# Patient Record
Sex: Male | Born: 1995 | Race: Black or African American | Hispanic: No | Marital: Single | Smoking: Never smoker
Health system: Southern US, Community
[De-identification: ages and names within clinical notes are randomized; demographics above are authoritative.]

## PROBLEM LIST (undated history)

## (undated) DIAGNOSIS — L309 Dermatitis, unspecified: Secondary | ICD-10-CM

## (undated) DIAGNOSIS — T7840XA Allergy, unspecified, initial encounter: Secondary | ICD-10-CM

## (undated) DIAGNOSIS — J45909 Unspecified asthma, uncomplicated: Secondary | ICD-10-CM

---
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
## 2017-09-13 ENCOUNTER — Emergency Department (HOSPITAL_COMMUNITY)
Admission: EM | Admit: 2017-09-13 | Discharge: 2017-09-13 | Disposition: A | Discharge status: Home / Self Care | Payer: Self-pay | Attending: Emergency Medicine | Admitting: Emergency Medicine

## 2017-09-13 ENCOUNTER — Encounter (HOSPITAL_COMMUNITY): Payer: Self-pay | Admitting: Emergency Medicine

## 2017-09-13 DIAGNOSIS — Z79899 Other long term (current) drug therapy: Secondary | ICD-10-CM | POA: Insufficient documentation

## 2017-09-13 DIAGNOSIS — Z7722 Contact with and (suspected) exposure to environmental tobacco smoke (acute) (chronic): Secondary | ICD-10-CM | POA: Insufficient documentation

## 2017-09-13 DIAGNOSIS — K623 Rectal prolapse: Secondary | ICD-10-CM | POA: Insufficient documentation

## 2017-09-13 DIAGNOSIS — J45909 Unspecified asthma, uncomplicated: Secondary | ICD-10-CM | POA: Insufficient documentation

## 2017-09-13 NOTE — ED Triage Notes (Signed)
Pt c/o rectal pain, pt  States he has had hemorrhoids in the past and yesterday he sat on hard cement for an extended period of time resulting in same type of pain.
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
## 2018-01-18 ENCOUNTER — Emergency Department: Payer: Self-pay

## 2018-01-18 ENCOUNTER — Encounter: Payer: Self-pay | Admitting: Emergency Medicine

## 2018-01-18 ENCOUNTER — Other Ambulatory Visit: Payer: Self-pay

## 2018-01-18 ENCOUNTER — Emergency Department
Admission: EM | Admit: 2018-01-18 | Discharge: 2018-01-18 | Payer: Self-pay | Attending: Emergency Medicine | Admitting: Emergency Medicine

## 2018-01-18 DIAGNOSIS — S022XXA Fracture of nasal bones, initial encounter for closed fracture: Secondary | ICD-10-CM | POA: Insufficient documentation

## 2018-01-18 DIAGNOSIS — R600 Localized edema: Secondary | ICD-10-CM | POA: Insufficient documentation

## 2018-01-18 DIAGNOSIS — S0181XA Laceration without foreign body of other part of head, initial encounter: Secondary | ICD-10-CM | POA: Insufficient documentation

## 2018-01-18 DIAGNOSIS — Y9389 Activity, other specified: Secondary | ICD-10-CM | POA: Insufficient documentation

## 2018-01-18 DIAGNOSIS — H53121 Transient visual loss, right eye: Secondary | ICD-10-CM | POA: Insufficient documentation

## 2018-01-18 DIAGNOSIS — Z7722 Contact with and (suspected) exposure to environmental tobacco smoke (acute) (chronic): Secondary | ICD-10-CM | POA: Insufficient documentation

## 2018-01-18 DIAGNOSIS — Y92149 Unspecified place in prison as the place of occurrence of the external cause: Secondary | ICD-10-CM | POA: Insufficient documentation

## 2018-01-18 DIAGNOSIS — R51 Headache: Secondary | ICD-10-CM | POA: Insufficient documentation

## 2018-01-18 DIAGNOSIS — M542 Cervicalgia: Secondary | ICD-10-CM | POA: Insufficient documentation

## 2018-01-18 DIAGNOSIS — Y999 Unspecified external cause status: Secondary | ICD-10-CM | POA: Insufficient documentation

## 2018-01-18 DIAGNOSIS — J45909 Unspecified asthma, uncomplicated: Secondary | ICD-10-CM | POA: Insufficient documentation

## 2018-01-18 MED ORDER — KETOROLAC TROMETHAMINE 30 MG/ML IJ SOLN
30.0000 mg | Freq: Once | INTRAMUSCULAR | Status: AC
  Administered 2018-01-18: 30 mg via INTRAMUSCULAR
  Filled 2018-01-18: qty 1

## 2018-01-18 MED ORDER — PROMETHAZINE HCL 25 MG/ML IJ SOLN
25.0000 mg | Freq: Once | INTRAMUSCULAR | Status: AC
  Administered 2018-01-18: 25 mg via INTRAMUSCULAR
  Filled 2018-01-18: qty 1

## 2018-01-18 MED ORDER — OXYCODONE-ACETAMINOPHEN 5-325 MG PO TABS
1.0000 | ORAL_TABLET | Freq: Once | ORAL | Status: AC
  Administered 2018-01-18: 1 via ORAL
  Filled 2018-01-18: qty 1

## 2018-01-18 MED ORDER — DIPHENHYDRAMINE HCL 50 MG/ML IJ SOLN
50.0000 mg | Freq: Once | INTRAMUSCULAR | Status: AC
  Administered 2018-01-18: 50 mg via INTRAMUSCULAR
  Filled 2018-01-18: qty 1

## 2018-01-18 MED ORDER — LIDOCAINE HCL (PF) 1 % IJ SOLN
10.0000 mL | Freq: Once | INTRAMUSCULAR | Status: AC
  Administered 2018-01-18: 10 mL
  Filled 2018-01-18: qty 10

## 2018-01-18 MED ORDER — ACETAMINOPHEN-CODEINE #3 300-30 MG PO TABS: 1.0000 | ORAL_TABLET | Freq: Four times a day (QID) | ORAL | 0 refills | Status: AC | PRN
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
## 2018-01-18 NOTE — ED Notes (Signed)
Christiane Ha , PA at bedisde at this time

## 2021-03-12 ENCOUNTER — Emergency Department (HOSPITAL_BASED_OUTPATIENT_CLINIC_OR_DEPARTMENT_OTHER): Payer: Self-pay

## 2021-03-12 ENCOUNTER — Other Ambulatory Visit: Payer: Self-pay

## 2021-03-12 ENCOUNTER — Emergency Department (HOSPITAL_BASED_OUTPATIENT_CLINIC_OR_DEPARTMENT_OTHER)
Admission: EM | Admit: 2021-03-12 | Discharge: 2021-03-12 | Disposition: A | Discharge status: Home / Self Care | Payer: Self-pay | Attending: Emergency Medicine | Admitting: Emergency Medicine

## 2021-03-12 ENCOUNTER — Encounter (HOSPITAL_BASED_OUTPATIENT_CLINIC_OR_DEPARTMENT_OTHER): Payer: Self-pay

## 2021-03-12 DIAGNOSIS — J069 Acute upper respiratory infection, unspecified: Secondary | ICD-10-CM | POA: Insufficient documentation

## 2021-03-12 DIAGNOSIS — Z20822 Contact with and (suspected) exposure to covid-19: Secondary | ICD-10-CM | POA: Insufficient documentation

## 2021-03-12 DIAGNOSIS — J4521 Mild intermittent asthma with (acute) exacerbation: Secondary | ICD-10-CM | POA: Insufficient documentation

## 2021-03-12 DIAGNOSIS — Z7722 Contact with and (suspected) exposure to environmental tobacco smoke (acute) (chronic): Secondary | ICD-10-CM | POA: Insufficient documentation

## 2021-03-12 LAB — RESP PANEL BY RT-PCR (FLU A&B, COVID) ARPGX2
Influenza A by PCR: NEGATIVE
Influenza B by PCR: NEGATIVE
SARS Coronavirus 2 by RT PCR: NEGATIVE

## 2021-03-12 MED ORDER — PREDNISONE 50 MG PO TABS
60.0000 mg | ORAL_TABLET | Freq: Once | ORAL | Status: AC
  Administered 2021-03-12: 60 mg via ORAL
  Filled 2021-03-12: qty 1

## 2021-03-12 MED ORDER — PREDNISONE 10 MG PO TABS: 20.0000 mg | ORAL_TABLET | Freq: Every day | ORAL | 0 refills | Status: DC

## 2021-03-12 MED ORDER — ALBUTEROL SULFATE HFA 108 (90 BASE) MCG/ACT IN AERS
2.0000 | INHALATION_SPRAY | RESPIRATORY_TRACT | Status: DC | PRN
  Administered 2021-03-12: 2 via RESPIRATORY_TRACT
  Filled 2021-03-12: qty 6.7

## 2021-03-12 NOTE — ED Triage Notes (Signed)
Shortness of breath starting app 30 mins prior to arrival.  Does has asthma history.
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
## 2021-05-15 ENCOUNTER — Other Ambulatory Visit: Payer: Self-pay

## 2021-05-15 DIAGNOSIS — Z79899 Other long term (current) drug therapy: Secondary | ICD-10-CM | POA: Insufficient documentation

## 2021-05-15 DIAGNOSIS — N342 Other urethritis: Secondary | ICD-10-CM | POA: Insufficient documentation

## 2021-05-15 DIAGNOSIS — Z7952 Long term (current) use of systemic steroids: Secondary | ICD-10-CM | POA: Insufficient documentation

## 2021-05-15 DIAGNOSIS — J45909 Unspecified asthma, uncomplicated: Secondary | ICD-10-CM | POA: Insufficient documentation

## 2021-05-15 NOTE — ED Triage Notes (Signed)
°  Patient comes in with lower abdominal pain and dysuria that started about two days ago.  Patient states pain is sharp but intermittent.  Had white discharge from penis yesterday and some nausea.  Denies multiple sexual partners.  Pain 2/10 at this time.

## 2021-05-16 ENCOUNTER — Emergency Department (HOSPITAL_BASED_OUTPATIENT_CLINIC_OR_DEPARTMENT_OTHER)
Admission: EM | Admit: 2021-05-16 | Discharge: 2021-05-16 | Disposition: A | Discharge status: Home / Self Care | Payer: Self-pay | Attending: Emergency Medicine | Admitting: Emergency Medicine

## 2021-05-16 ENCOUNTER — Encounter (HOSPITAL_BASED_OUTPATIENT_CLINIC_OR_DEPARTMENT_OTHER): Payer: Self-pay | Admitting: Emergency Medicine

## 2021-05-16 DIAGNOSIS — N342 Other urethritis: Secondary | ICD-10-CM

## 2021-05-16 LAB — HIV ANTIBODY (ROUTINE TESTING W REFLEX): HIV Screen 4th Generation wRfx: NONREACTIVE

## 2021-05-16 LAB — URINALYSIS, ROUTINE W REFLEX MICROSCOPIC
Bilirubin Urine: NEGATIVE
Glucose, UA: NEGATIVE mg/dL
Nitrite: NEGATIVE
Protein, ur: 30 mg/dL — AB
Specific Gravity, Urine: 1.03 (ref 1.005–1.030)
WBC, UA: >50 WBC/hpf — ABNORMAL HIGH (ref 0–5)
pH: 5.5 (ref 5.0–8.0)

## 2021-05-16 LAB — RPR: RPR Ser Ql: NONREACTIVE

## 2021-05-16 MED ORDER — CEFTRIAXONE SODIUM 500 MG IJ SOLR
500.0000 mg | Freq: Once | INTRAMUSCULAR | Status: AC
Start: 2021-05-16 — End: 2021-05-16
  Administered 2021-05-16: 500 mg via INTRAMUSCULAR
  Filled 2021-05-16: qty 500

## 2021-05-16 MED ORDER — DOXYCYCLINE HYCLATE 100 MG PO CAPS: 100.0000 mg | ORAL_CAPSULE | Freq: Two times a day (BID) | ORAL | 0 refills | Status: DC

## 2021-05-16 MED ORDER — DOXYCYCLINE HYCLATE 100 MG PO TABS
100.0000 mg | ORAL_TABLET | Freq: Once | ORAL | Status: AC
  Administered 2021-05-16: 100 mg via ORAL
  Filled 2021-05-16: qty 1

## 2021-05-16 NOTE — Discharge Instructions (Addendum)
Your test for HIV, syphilis, gonorrhea, chlamydia should be available on MyChart in the next 2-3 days.  Make sure all sexual partners are treated.

## 2021-05-16 NOTE — ED Provider Notes (Signed)
[facility] EMERGENCY DEPT Provider Note   CSN: [id number] Arrival date & time: 05/15/21  2351     History  Chief Complaint  Patient presents with   Abdominal Pain   Dysuria    Bruce Scott is a 26 y.o. male.  The history is provided by the patient.  Abdominal Pain Associated symptoms: dysuria   Dysuria Presenting symptoms: dysuria   Associated symptoms: abdominal pain   He has history of asthma and was in because of dysuria for the last week.  He initially had a greenish-white discharge, but that has resolved.  He denies any abdominal pain.  He denies fever or chills.  Denies nausea or vomiting.  He does admit to having had unprotected sex.   Home Medications Prior to Admission medications   Medication Sig Start Date End Date Taking? Authorizing Provider  albuterol (PROVENTIL HFA;VENTOLIN HFA) 108 (90 BASE) MCG/ACT inhaler Inhale 2 puffs into the lungs every 6 (six) hours as needed for wheezing.    [provider]  beclomethasone (QVAR) 80 MCG/ACT inhaler Inhale 2 puffs into the lungs daily as needed (wheezing.).     [provider]  predniSONE (DELTASONE) 10 MG tablet Take 2 tablets (20 mg total) by mouth daily. [id number]   Delora Fuel, MD      Allergies    Patient has no known allergies.    Review of Systems   Review of Systems  Gastrointestinal:  Positive for abdominal pain.  Genitourinary:  Positive for dysuria.  All other systems reviewed and are negative.  Physical Exam Updated Vital Signs BP 135/74 (BP Location: Right Arm)    Pulse 95    Temp 98.1 F (36.7 C) (Oral)    Resp 20    Ht 6\' 5"  (1.956 m)    Wt 81.6 kg    SpO2 100%    BMI 21.34 kg/m  Physical Exam Vitals and nursing note reviewed.  26 year old male, resting comfortably and in no acute distress. Vital signs are normal. Oxygen saturation is 100%, which is normal. Head is normocephalic and atraumatic. PERRLA, EOMI. Oropharynx is clear. Neck is nontender and supple  without adenopathy or JVD. Back is nontender and there is no CVA tenderness. Lungs are clear without rales, wheezes, or rhonchi. Chest is nontender. Heart has regular rate and rhythm without murmur. Abdomen is soft, flat, nontender. Genitalia: Circumcised penis.  No discharge seen.  Testes descended without masses.  Shotty inguinal adenopathy present bilaterally. Extremities have no cyanosis or edema, full range of motion is present. Skin is warm and dry without rash. Neurologic: Mental status is normal, cranial nerves are intact, moves all extremities equally.  ED Results / Procedures / Treatments   Labs (all labs ordered are listed, but only abnormal results are displayed) Labs Reviewed  URINALYSIS, ROUTINE W REFLEX MICROSCOPIC - Abnormal; Notable for the following components:      Result Value   Hgb urine dipstick SMALL (*)    Ketones, ur TRACE (*)    Protein, ur 30 (*)    Leukocytes,Ua LARGE (*)    WBC, UA >50 (*)    Bacteria, UA FEW (*)    All other components within normal limits  GC/CHLAMYDIA PROBE AMP ([place]) NOT AT [facility]    EKG None  Radiology No results found.  Procedures Procedures    Medications Ordered in ED Medications - No data to display  ED Course/ Medical Decision Making/ A&P  Medical Decision Making Amount and/or Complexity of Data Reviewed Labs: ordered.   Urethritis, probably gonorrhea or chlamydia.  Specimens are sent for culture as well as blood test for HIV and syphilis.  Old records are reviewed, and he has no relevant past visits.  He is given an dose of ceftriaxone and a prescription for doxycycline, told to have all sexual partners treated appropriately.        Final Clinical Impression(s) / ED Diagnoses Final diagnoses:  Urethritis    Rx / DC Orders ED Discharge Orders          Ordered    doxycycline (VIBRAMYCIN) 100 MG capsule  2 times daily        05/16/21 [id number]              Delora Fuel, MD [id number]

## 2021-05-19 LAB — GC/CHLAMYDIA PROBE AMP (~~LOC~~) NOT AT ARMC
Chlamydia: POSITIVE — AB
Comment: NEGATIVE
Comment: NORMAL
Neisseria Gonorrhea: NEGATIVE

## 2022-03-20 DIAGNOSIS — Z419 Encounter for procedure for purposes other than remedying health state, unspecified: Secondary | ICD-10-CM | POA: Diagnosis not present

## 2022-04-18 ENCOUNTER — Emergency Department (HOSPITAL_COMMUNITY)
Admission: EM | Admit: 2022-04-18 | Discharge: 2022-04-19 | Discharge status: Against Medical Advice | Payer: Medicaid Other | Attending: Emergency Medicine | Admitting: Emergency Medicine

## 2022-04-18 ENCOUNTER — Emergency Department (HOSPITAL_COMMUNITY): Payer: Medicaid Other

## 2022-04-18 DIAGNOSIS — K649 Unspecified hemorrhoids: Secondary | ICD-10-CM | POA: Insufficient documentation

## 2022-04-18 DIAGNOSIS — Z5321 Procedure and treatment not carried out due to patient leaving prior to being seen by health care provider: Secondary | ICD-10-CM | POA: Diagnosis not present

## 2022-04-18 DIAGNOSIS — R079 Chest pain, unspecified: Secondary | ICD-10-CM | POA: Diagnosis not present

## 2022-04-18 LAB — BASIC METABOLIC PANEL
Anion gap: 10 (ref 5–15)
BUN: 10 mg/dL (ref 6–20)
CO2: 23 mmol/L (ref 22–32)
Calcium: 9.1 mg/dL (ref 8.9–10.3)
Chloride: 107 mmol/L (ref 98–111)
Creatinine, Ser: 1.14 mg/dL (ref 0.61–1.24)
GFR, Estimated: >60 mL/min (ref >60)
Glucose, Bld: 134 mg/dL — ABNORMAL HIGH (ref 70–99)
Potassium: 4 mmol/L (ref 3.5–5.1)
Sodium: 140 mmol/L (ref 135–145)

## 2022-04-18 LAB — CBC
HCT: 42.3 % (ref 39.0–52.0)
Hemoglobin: 13.6 g/dL (ref 13.0–17.0)
MCH: 22.3 pg — ABNORMAL LOW (ref 26.0–34.0)
MCHC: 32.2 g/dL (ref 30.0–36.0)
MCV: 69.5 fL — ABNORMAL LOW (ref 80.0–100.0)
Platelets: 241 10*3/uL (ref 150–400)
RBC: 6.09 MIL/uL — ABNORMAL HIGH (ref 4.22–5.81)
RDW: 16.5 % — ABNORMAL HIGH (ref 11.5–15.5)
WBC: 5.7 10*3/uL (ref 4.0–10.5)
nRBC: 0 % (ref 0.0–0.2)

## 2022-04-18 LAB — TROPONIN I (HIGH SENSITIVITY): Troponin I (High Sensitivity): <2 ng/L (ref <18)

## 2022-04-18 NOTE — ED Triage Notes (Addendum)
Pt to ED c/o chest pain that started this morning when woke up. Denies SHOB/N/V. Reports worse with exhaustion and when taking a deep breath.  No s/s of distress noted in triage.   Also requesting treatment for hemorrhoids

## 2022-04-19 LAB — TROPONIN I (HIGH SENSITIVITY): Troponin I (High Sensitivity): <2 ng/L (ref <18)

## 2022-04-19 NOTE — ED Notes (Signed)
PT turned in labels and stated he was leaving

## 2022-04-20 DIAGNOSIS — Z419 Encounter for procedure for purposes other than remedying health state, unspecified: Secondary | ICD-10-CM | POA: Diagnosis not present

## 2022-04-21 ENCOUNTER — Ambulatory Visit (HOSPITAL_COMMUNITY)
Admission: EM | Admit: 2022-04-21 | Discharge: 2022-04-21 | Disposition: A | Discharge status: Home / Self Care | Payer: Medicaid Other

## 2022-04-22 ENCOUNTER — Ambulatory Visit (HOSPITAL_COMMUNITY): Payer: Medicaid Other

## 2022-05-04 ENCOUNTER — Ambulatory Visit (HOSPITAL_COMMUNITY)
Admission: RE | Admit: 2022-05-04 | Discharge: 2022-05-04 | Disposition: A | Discharge status: Home / Self Care | Payer: Medicaid Other | Source: Ambulatory Visit | Attending: Internal Medicine | Admitting: Internal Medicine

## 2022-05-04 ENCOUNTER — Encounter (HOSPITAL_COMMUNITY): Payer: Self-pay

## 2022-05-04 VITALS — BP 122/79 | HR 85 | Temp 98.1°F | Resp 18

## 2022-05-04 DIAGNOSIS — J4520 Mild intermittent asthma, uncomplicated: Secondary | ICD-10-CM

## 2022-05-04 MED ORDER — BECLOMETHASONE DIPROPIONATE 80 MCG/ACT IN AERS: 2.0000 | INHALATION_SPRAY | Freq: Every day | RESPIRATORY_TRACT | 0 refills | Status: AC | PRN

## 2022-05-04 MED ORDER — ALBUTEROL SULFATE HFA 108 (90 BASE) MCG/ACT IN AERS: 2.0000 | INHALATION_SPRAY | Freq: Four times a day (QID) | RESPIRATORY_TRACT | 0 refills | Status: AC | PRN

## 2022-05-04 NOTE — Discharge Instructions (Signed)
Work on quit smoking Call the primary care provider on your card and make an appointment for future refills of your medication with her.

## 2022-05-04 NOTE — ED Triage Notes (Signed)
Pt requesting refill on his asthma medication, albuterol and QVAR inhaler. States not having any problems but they are on the red.
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
## 2022-07-01 ENCOUNTER — Telehealth: Payer: Self-pay

## 2022-07-01 NOTE — Telephone Encounter (Signed)
Mychart msg sent

## 2023-11-05 ENCOUNTER — Encounter: Payer: Self-pay | Admitting: Advanced Practice Midwife
